# Patient Record
Sex: Male | Born: 1985 | Race: Black or African American | Hispanic: No | Marital: Single | Smoking: Current some day smoker
Health system: Southern US, Community
[De-identification: ages and names within clinical notes are randomized; demographics above are authoritative.]

---

## 2007-11-03 ENCOUNTER — Emergency Department (HOSPITAL_COMMUNITY): Admission: EM | Admit: 2007-11-03 | Discharge: 2007-11-03 | Payer: Self-pay | Admitting: Emergency Medicine

## 2008-02-12 ENCOUNTER — Emergency Department (HOSPITAL_COMMUNITY): Admission: EM | Admit: 2008-02-12 | Discharge: 2008-02-12 | Payer: Self-pay | Admitting: Emergency Medicine

## 2009-05-27 ENCOUNTER — Emergency Department (HOSPITAL_COMMUNITY): Admission: EM | Admit: 2009-05-27 | Discharge: 2009-05-27 | Payer: Self-pay | Admitting: Emergency Medicine

## 2010-12-28 ENCOUNTER — Emergency Department (HOSPITAL_COMMUNITY)
Admission: EM | Admit: 2010-12-28 | Discharge: 2010-12-28 | Disposition: A | Discharge status: Home / Self Care | Payer: Self-pay | Attending: Emergency Medicine | Admitting: Emergency Medicine

## 2011-08-30 IMAGING — CR DG CHEST 2V
2 series · 2 of 2 positions shown · non-contrast
Comparison: None

CLINICAL DATA: Chest pain, shortness of breath

CHEST - 2 VIEW

[w chest pa]
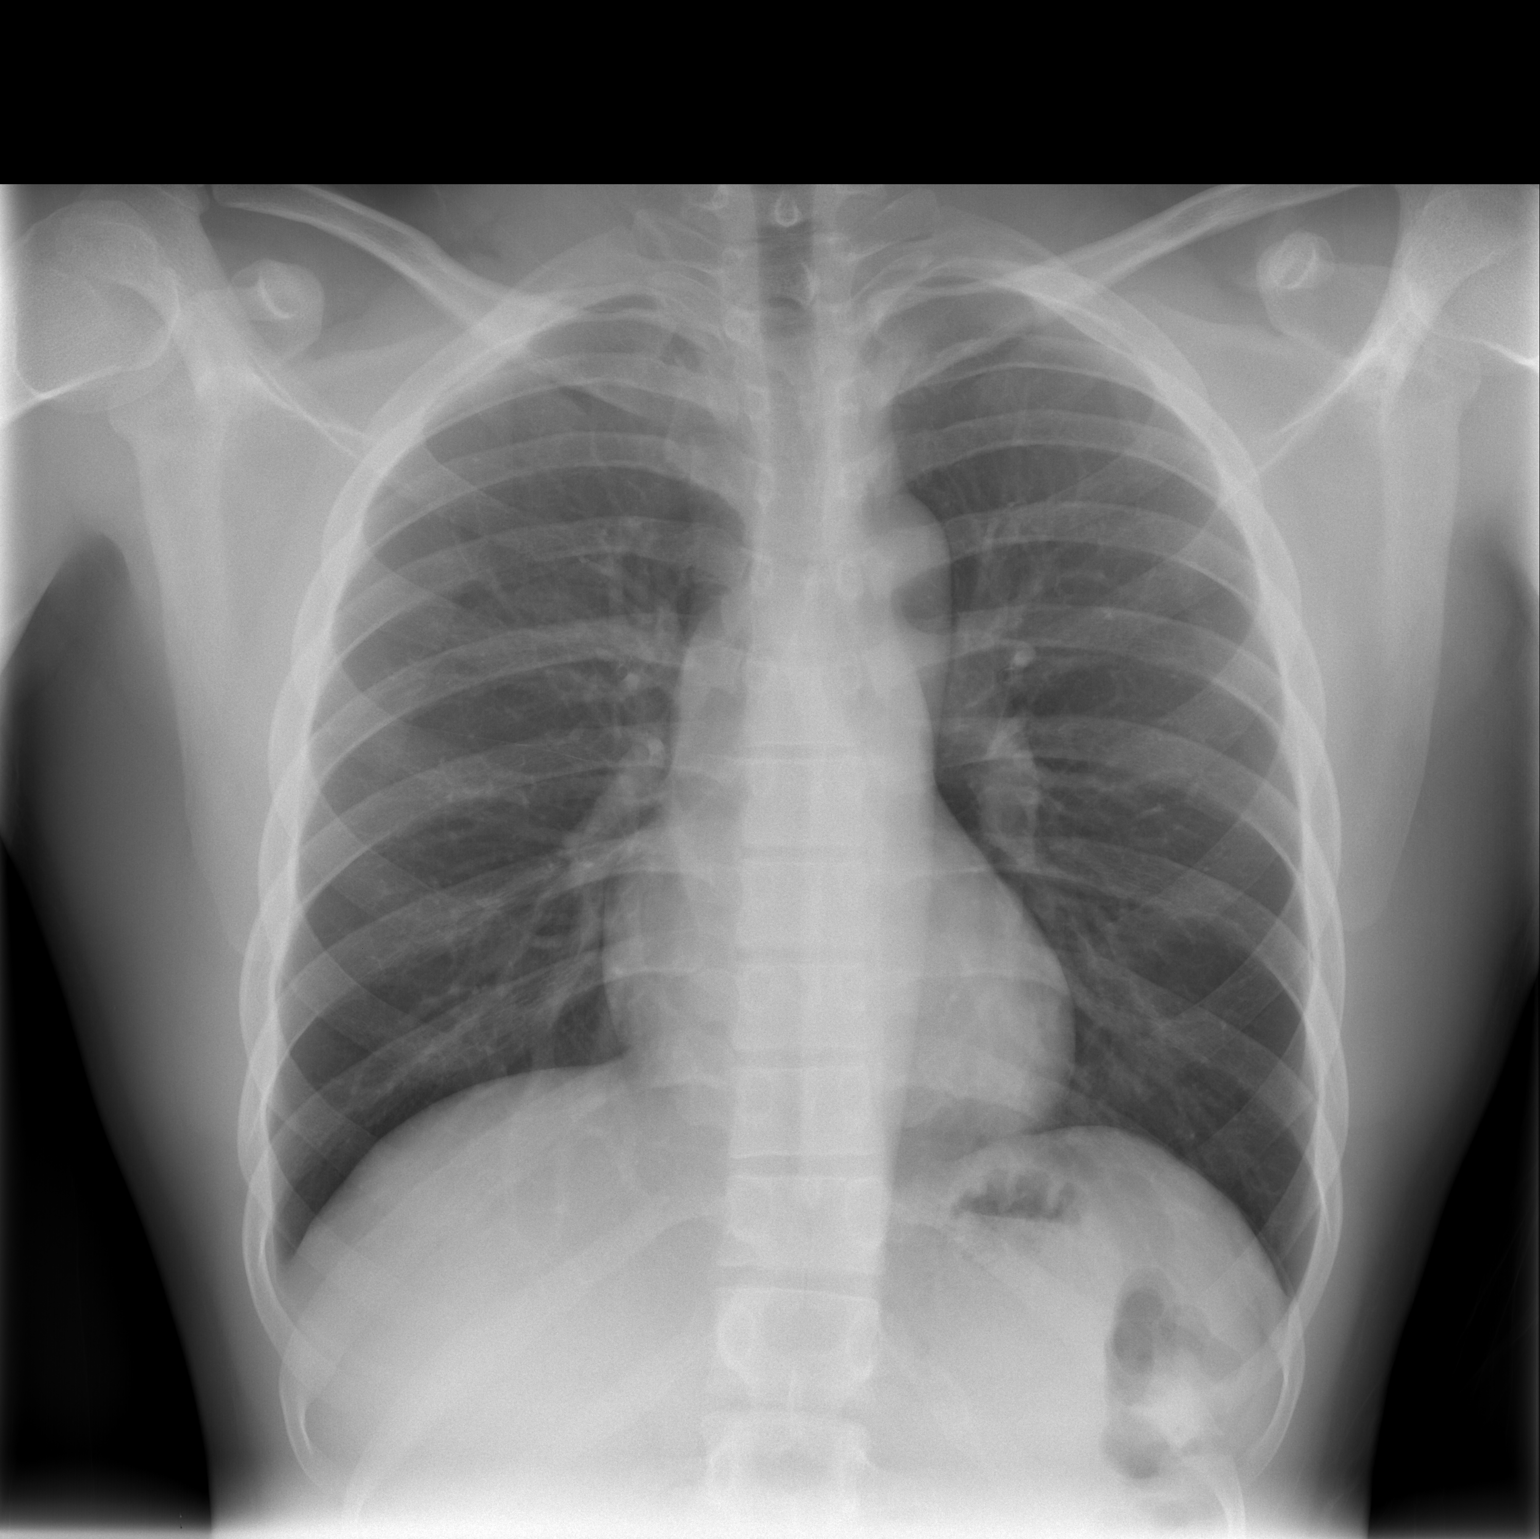

[w chest lat]
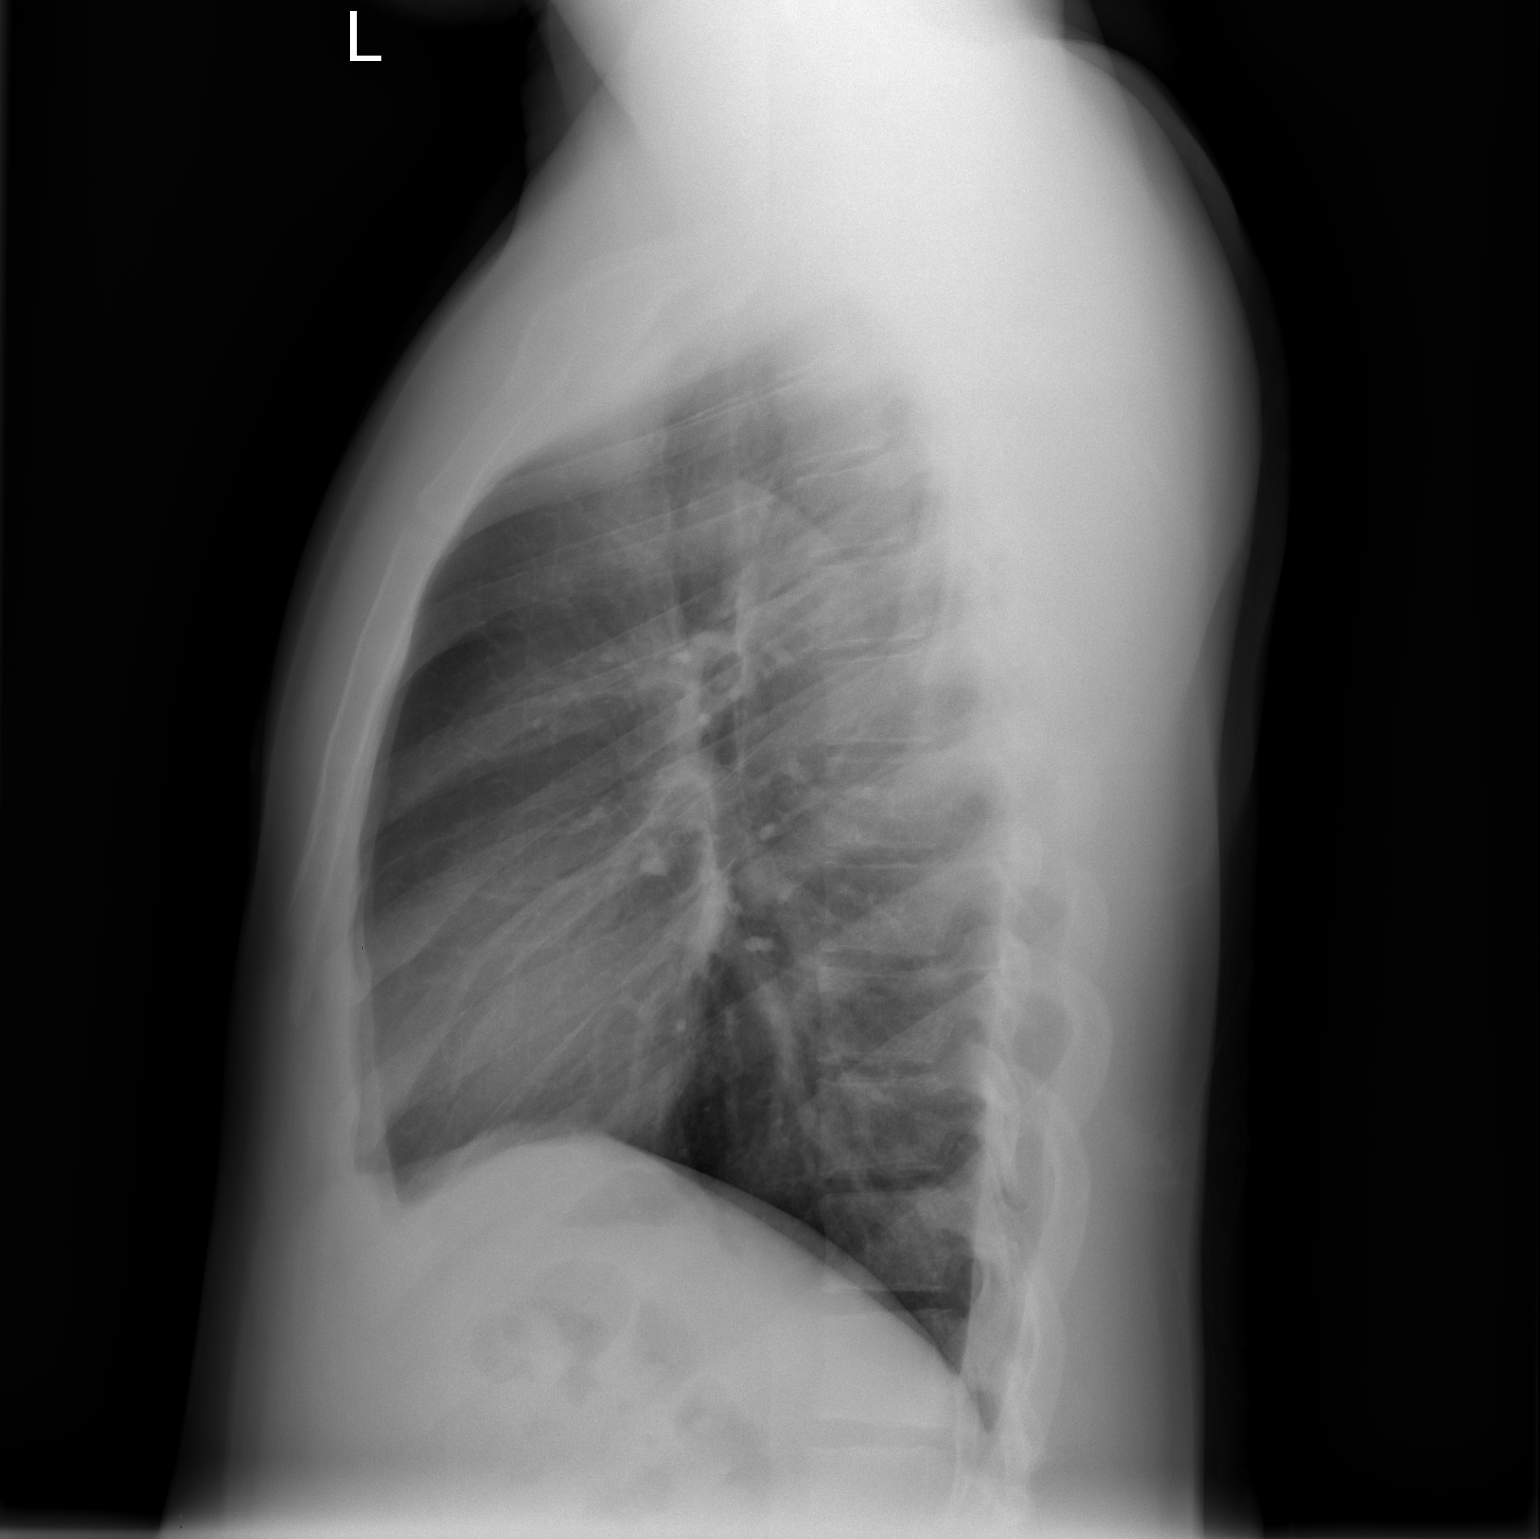

[2 of 2 positions shown; findings below may reference images not displayed]

FINDINGS: The lungs are clear.  The heart is within normal limits
in size.  Mediastinal contours are normal.  No bony abnormality is
seen.
IMPRESSION: No active lung disease.

## 2013-08-16 ENCOUNTER — Emergency Department (HOSPITAL_COMMUNITY): Payer: 59

## 2013-08-16 ENCOUNTER — Encounter (HOSPITAL_COMMUNITY): Payer: Self-pay | Admitting: Emergency Medicine

## 2013-08-16 ENCOUNTER — Emergency Department (HOSPITAL_COMMUNITY)
Admission: EM | Admit: 2013-08-16 | Discharge: 2013-08-16 | Disposition: A | Discharge status: Home / Self Care | Payer: 59 | Attending: Emergency Medicine | Admitting: Emergency Medicine

## 2013-08-16 DIAGNOSIS — F17200 Nicotine dependence, unspecified, uncomplicated: Secondary | ICD-10-CM | POA: Insufficient documentation

## 2013-08-16 DIAGNOSIS — R944 Abnormal results of kidney function studies: Secondary | ICD-10-CM | POA: Insufficient documentation

## 2013-08-16 DIAGNOSIS — R079 Chest pain, unspecified: Secondary | ICD-10-CM | POA: Insufficient documentation

## 2013-08-16 LAB — BASIC METABOLIC PANEL
BUN: 16 mg/dL (ref 6–23)
CALCIUM: 9.4 mg/dL (ref 8.4–10.5)
CO2: 28 mEq/L (ref 19–32)
Chloride: 101 mEq/L (ref 96–112)
Creatinine, Ser: 1.45 mg/dL — ABNORMAL HIGH (ref 0.50–1.35)
GFR calc Af Amer: 76 mL/min — ABNORMAL LOW (ref >90)
GFR, EST NON AFRICAN AMERICAN: 66 mL/min — AB (ref >90)
Glucose, Bld: 95 mg/dL (ref 70–99)
Potassium: 4.7 mEq/L (ref 3.7–5.3)
SODIUM: 139 meq/L (ref 137–147)

## 2013-08-16 LAB — CBC
HCT: 41.8 % (ref 39.0–52.0)
HEMOGLOBIN: 14.4 g/dL (ref 13.0–17.0)
MCH: 31.2 pg (ref 26.0–34.0)
MCHC: 34.4 g/dL (ref 30.0–36.0)
MCV: 90.5 fL (ref 78.0–100.0)
PLATELETS: 152 10*3/uL (ref 150–400)
RBC: 4.62 MIL/uL (ref 4.22–5.81)
RDW: 12.8 % (ref 11.5–15.5)
WBC: 3.5 10*3/uL — ABNORMAL LOW (ref 4.0–10.5)

## 2013-08-16 LAB — I-STAT TROPONIN, ED: Troponin i, poc: 0.01 ng/mL (ref 0.00–0.08)

## 2013-08-16 MED ORDER — SODIUM CHLORIDE 0.9 % IV BOLUS (SEPSIS)
1000.0000 mL | Freq: Once | INTRAVENOUS | Status: AC
  Administered 2013-08-16: 1000 mL via INTRAVENOUS

## 2013-08-16 MED ORDER — HYDROCODONE-ACETAMINOPHEN 5-325 MG PO TABS: 1.0000 | ORAL_TABLET | ORAL | Status: AC | PRN

## 2013-08-16 MED ORDER — MORPHINE SULFATE 4 MG/ML IJ SOLN
4.0000 mg | Freq: Once | INTRAMUSCULAR | Status: AC
  Administered 2013-08-16: 4 mg via INTRAVENOUS
  Filled 2013-08-16: qty 1

## 2013-08-16 NOTE — ED Notes (Signed)
Per pt, states left sided chest pain since yesterday radiating to center of back-some SOB

## 2013-08-16 NOTE — ED Provider Notes (Signed)
CSN: [id number]     Arrival date & time 08/16/13  1215 History   First MD Initiated Contact with Patient 08/16/13 1252     Chief Complaint  Patient presents with  . Chest Pain     (Consider location/radiation/quality/duration/timing/severity/associated sxs/prior Treatment) HPI Comments: The patient is a healthy 28 year old male, sent to the emergency department with a chief complaint of left-sided chest discomfort since yesterday. The patient reports a gradual onset in the morning which increased throughout the day. He is described as constant left-sided, nonexertional, sharp.  Aggravating factors include leaning forward, landing on size, deep inspiration, deep expiration, left upper extremity movement. She reports a similar episode previously but was less intense, self resolved, and he was not evaluated by medical professional that time.  He denies recent GI illness, or upper respiratory infection. He reports last EtOH over 2 days ago. Denies IVDU, amphetamine, and cocaine use.  The history is provided by the patient. No language interpreter was used.    History reviewed. No pertinent past medical history. History reviewed. No pertinent past surgical history. No family history on file. History  Substance Use Topics  . Smoking status: Current Some Day Smoker  . Smokeless tobacco: Not on file  . Alcohol Use: Yes    Review of Systems  Constitutional: Negative for fever and chills.  HENT: Negative for congestion, rhinorrhea and sore throat.   Respiratory: Negative for cough, chest tightness, shortness of breath and wheezing.   Cardiovascular: Positive for chest pain. Negative for palpitations and leg swelling.  Gastrointestinal: Negative for nausea, abdominal pain, diarrhea and constipation.  Skin: Negative for rash.  Neurological: Negative for syncope and light-headedness.      Allergies  Review of patient's allergies indicates no known allergies.  Home Medications   Prior to  Admission medications   Medication Sig Start Date End Date Taking? Authorizing Provider  Aspirin-Acetaminophen-Caffeine (GOODY HEADACHE PO) Take 1 packet by mouth once as needed (pain.).   Yes Historical Provider, MD  PRESCRIPTION MEDICATION Take 1 puff by mouth once. Inhaler.   Yes Historical Provider, MD   BP 129/85  Pulse 73  Temp(Src) 98 F (36.7 C) (Oral)  Resp 20  SpO2 100% Physical Exam  Nursing note and vitals reviewed. Constitutional: He is oriented to person, place, and time. He appears well-developed and well-nourished.  Non-toxic appearance. He does not have a sickly appearance. He does not appear ill. No distress.  HENT:  Head: Normocephalic and atraumatic.  Eyes: EOM are normal. Pupils are equal, round, and reactive to light. Right eye exhibits no discharge. Left eye exhibits no discharge.  Neck: Neck supple.  Cardiovascular: Normal rate and regular rhythm.   No murmur heard. No lower extremity edema  Pulmonary/Chest: Effort normal and breath sounds normal. No respiratory distress. He has no wheezes. He has no rales. He exhibits no tenderness.  Patient is able to speak in complete sentences.   Abdominal: Soft. He exhibits no mass. There is no tenderness. There is no rebound and no guarding.  Musculoskeletal: Normal range of motion.  Neurological: He is alert and oriented to person, place, and time.  Skin: Skin is warm and dry. He is not diaphoretic.  Psychiatric: He has a normal mood and affect. His behavior is normal.    ED Course  Procedures (including critical care time) Labs Review Labs Reviewed  CBC - Abnormal; Notable for the following:    WBC 3.5 (*)    All other components within normal limits  BASIC METABOLIC  PANEL - Abnormal; Notable for the following:    Creatinine, Ser 1.45 (*)    GFR calc non Af Amer 66 (*)    GFR calc Af Amer 76 (*)    All other components within normal limits  I-STAT TROPOININ, ED    Imaging Review Dg Chest 2  View  08/16/2013   CLINICAL DATA:  Mid to left-sided chest pain  EXAM: CHEST  2 VIEW  COMPARISON:  Prior chest x-ray 05/27/2009  FINDINGS: The lungs are clear and negative for focal airspace consolidation, pulmonary edema or suspicious pulmonary nodule. No pleural effusion or pneumothorax. Cardiac and mediastinal contours are within normal limits. No acute fracture or lytic or blastic osseous lesions. The visualized upper abdominal bowel gas pattern is unremarkable.  IMPRESSION: No active cardiopulmonary disease.   Electronically Signed   By: Malachy MoanHeath  McCullough M.D.   On: 08/16/2013 13:35     EKG Interpretation   Date/Time:  Thursday August 16 2013 12:28:40 EDT Ventricular Rate:  76 PR Interval:  172 QRS Duration: 90 QT Interval:  361 QTC Calculation: 406 R Axis:   74 Text Interpretation:  Sinus rhythm Inverted T waves have replaced  nonspecific T wave abnormality in Nonspecific ST elevation Baseline wander  in lead(s) III No previous tracing Confirmed by POLLINA  MD, CHRISTOPHER  [phone]([id number]) on 08/16/2013 1:34:33 PM      MDM   Final diagnoses:  Chest pain  Abnormal renal function test   Pt with constant chest pain since last night, reproducible with position and left upper extremity movement.  PERC negative, No risk factors for endocarditis, no risk factors for pericarditis (also pain increase with sitting forward), No risk factors for aortic dissection. Discussed with Dr. Blinda LeatherwoodPollina, advises troponin for other non-ACS related cardiac etiologies.  Re-eval discussed results with patient.  And need for IV fluids due to Cr levels.  Advise follow testing with PCP.  Requesting pain medication at this time. Meds given in ED:  Medications  sodium chloride 0.9 % bolus 1,000 mL (0 mLs Intravenous Stopped 08/16/13 1648)  morphine 4 MG/ML injection 4 mg (4 mg Intravenous Given 08/16/13 1619)    Discharge Medication List as of 08/16/2013  4:09 PM    START taking these medications   Details   HYDROcodone-acetaminophen (NORCO/VICODIN) 5-325 MG per tablet Take 1 tablet by mouth every 4 (four) hours as needed., Starting 08/16/2013, Until Discontinued, Print          Clabe SealLauren M Barrett Goldie, PA-C 08/17/13 1541

## 2013-08-16 NOTE — ED Notes (Signed)
Pt escorted to discharge window. Pt verbalized understanding discharge instructions. In no acute distress.  

## 2013-08-16 NOTE — ED Notes (Signed)
Bed: WA17 Expected date:  Expected time:  Means of arrival:  Comments: Hold for triage 1 

## 2013-08-16 NOTE — ED Notes (Signed)
Pt alert and oriented x4. Respirations even and unlabored, bilateral symmetrical rise and fall of chest. Skin warm and dry. In no acute distress. Denies needs.   

## 2013-08-16 NOTE — Discharge Instructions (Signed)
Call for a follow up appointment with a Family or Primary Care Provider for repeat testing of your renal function.  Call a Cardiologist for further evaluation of your chest pain. Return if symptoms worsen, you have shortness of breath, fever, cough, lightheadedness .  Take medication as prescribed.    Emergency Department Resource Guide 1) Find a Doctor and Pay Out of Pocket Although you won't have to find out who is covered by your insurance plan, it is a good idea to ask around and get recommendations. You will then need to call the office and see if the doctor you have chosen will accept you as a new patient and what types of options they offer for patients who are self-pay. Some doctors offer discounts or will set up payment plans for their patients who do not have insurance, but you will need to ask so you aren't surprised when you get to your appointment.  2) Contact Your Local Health Department Not all health departments have doctors that can see patients for sick visits, but many do, so it is worth a call to see if yours does. If you don't know where your local health department is, you can check in your phone book. The CDC also has a tool to help you locate your state's health department, and many state websites also have listings of all of their local health departments.  3) Find a Walk-in Clinic If your illness is not likely to be very severe or complicated, you may want to try a walk in clinic. These are popping up all over the country in pharmacies, drugstores, and shopping centers. They're usually staffed by nurse practitioners or physician assistants that have been trained to treat common illnesses and complaints. They're usually fairly quick and inexpensive. However, if you have serious medical issues or chronic medical problems, these are probably not your best option.  No Primary Care Doctor: - Call Health Connect at  [phone] - they can help you locate a primary care doctor that   accepts your insurance, provides certain services, etc. - Physician Referral Service- [phone]  Chronic Pain Problems: Organization         Address  Phone   Notes  [facility]  [phone] Patients need to be referred by their primary care doctor.   Medication Assistance: Organization         Address  Phone   Notes  [facility] [address] [place], [place] [address] [phone] --Must be a resident of [place] [facility] [place] -- Must have NO insurance coverage whatsoever (no Medicaid/ Medicare, etc.) -- The pt. MUST have a primary care doctor that directs their care regularly and follows them in the community   MedAssist  [phone]   [employer]  [phone]    Agencies that provide inexpensive medical care: Organization         Address  Phone   Notes  [facility]  [phone]   [facility]    [phone]   [facility] [address] [place], [place] [address] [phone]   [facility] [address], [place] [phone]   [facility]    [phone]   [facility]    [phone]   [facility]  [address], [place] Phone:  [phone], Fax:  [phone] Hours of Operation:  9  am - 6 pm, M-F.  Also accepts Medicaid/Medicare and self-pay.  [facility] for [place] [address], [address], [place] Phone: [phone], Fax: [phone]. Hours of Operation:  8:30 am - 5:30 pm, M-F.  Also accepts Medicaid and self-pay.  [facility] [place] [address], [place] Phone: [phone]   [place], [place], [place] [phone], Ext. [phone] Mondays & Thursdays: 7-9 AM.  First 15 patients are seen on a first come, first serve basis.    [place] Providers:  Organization          Address  Phone   Notes  [facility] [address], [place] [phone] Also accepts self-pay patients.  [facility] [address], [place]  [phone]   [place], [address], [place] [phone]   [facility] [address], [place] [phone]   Lucianne Lei [address], [place]   [phone] Only accepts [place] Access [place] patients after they have their name applied to their card.   Self-Pay (no insurance) in [facility]:  Organization         Address  Phone   Notes  Sickle Cell Patients, [facility] [place] [phone]   [facility] [place] [phone]   [facility] [place]  [place], [place], [place] [phone]   [facility]/Dr. Osei-Bonsu  [address], [place] or [address], [place] [phone] Phone number for both [place] and [place] locations is the same.  [facility] [address], [place] [phone]   [facility] [address], [place] or [address] [phone]   [facility] [address], [place] [phone], phone; [phone], fax Sees patients 1st and 3rd Saturday of every month.  Must not qualify for public or private insurance (i.e. Medicaid, Medicare, [place] Health Choice, Veterans' Benefits)  Household income should be no more than 200% of the poverty level The clinic cannot treat you if you are pregnant or think you are pregnant  Sexually transmitted diseases are not treated at the clinic.    Dental Care: Organization         Address  Phone  Notes  [facility] of [facility] [place] [phone] Accepts children up to age 52 who are enrolled in [place] or [place]; pregnant women with a Medicaid card; and children who have applied for Medicaid or [place] Health Choice, but were declined, whose parents can pay a reduced fee at time of service.  [facility] Department of [facility]  [address], [place] [phone] Accepts children up to age 78 who are enrolled in [place] or [place]; pregnant women with a Medicaid card; and children who have applied for Medicaid or  Health Choice, but were declined, whose parents can pay a reduced fee at time of service.  [facility] Adult [facility] Access PROGRAM  [place] [phone] Patients are seen by appointment only. Walk-ins are not accepted. [place] will see patients 18 years of  age and older. Monday - Tuesday (8am-5pm) Most Wednesdays (8:30-5pm) $30 per visit, cash only  [facility] Adult Dental Access PROGRAM  [address], [facility] [phone] Patients are seen by appointment only. Walk-ins are not accepted. [place] will see patients 57 years of age and older. One Wednesday Evening (Monthly: Volunteer Based).  $30 per visit, cash only  [place]  [phone] for adults; Children under age 78, call Graduate Pediatric Dentistry at [phone]. Children aged 43-14, please call [phone] to request a pediatric application.  Dental services are provided in all areas of dental care including fillings, crowns and bridges, complete and partial dentures, implants, gum treatment, root canals, and extractions. Preventive care is also provided. Treatment is provided to both adults and children. Patients are selected via a lottery and there is often a waiting list.   [facility] [address] [place]  [phone] [email or web address]   [facility] [address] [place], [place]  [phone], Ext. [phone] Second and Fourth Thursday of each month, opens at 6:30 AM; Clinic ends at 9 AM.  Patients are seen on a first-come first-served basis, and a limited number are seen during each clinic.   [facility]  [address] Hillard Danker [place], [place] [phone]   Eligibility Requirements You must have lived in [place], [place], or [place] counties for at least the last three months.   You cannot be eligible for state or federal sponsored [employer], including [employer], [place], or [employer].   You generally cannot be eligible for healthcare insurance through your employer.    How to apply: Eligibility screenings are held every Tuesday and Wednesday afternoon from 1:00 pm until 4:00 pm. You do not need an appointment for the interview!  [facility] [address] [place], [place]   [place]  [place] Department  [place]  [phone]    Behavioral Health Resources in the Community: Intensive Outpatient Programs Organization         Address  Phone  Notes  [place]. [address], [place], [place] [phone]   [facility] Outpatient [address], [place], [place]   ADS: Alcohol & Drug Svcs [address] [place], [place]   [place] [address],  [place], [place] or [phone]   Substance Abuse Resources Organization         Address  Phone  Notes  Alcohol and Drug Services  [phone]   [place]  [phone]   The [place]   Chinita Pester  [phone]   Residential & Outpatient Substance Abuse Program  [phone]   Psychological Services Organization         Address  Phone  Notes  [facility] [place]  [place]  [phone]    [facility] [address] [place] or [phone]    Mobile Crisis Teams Organization         Address  Phone  Notes  [facility], Mobile Crisis Care Unit  [phone]   [facility] Psychotherapeutic Services  [address] [place], [place]   [facility] [address] [place] [phone]    Self-Help/Support Groups Organization  Address  Phone             Notes  [place]. of [place] - variety of support groups  [place] Call for more information  Narcotics Anonymous (NA), [facility] [address], [employer] [place]  2 meetings at this location   Special educational needs teacher         Address  Phone  Notes  ASAP Residential Treatment [place],    [place]  [phone]   [facility]  [address] [place] [phone], [place], [place]   [place], [place] [phone] Admissions: 8am-3pm M-F  Incentives Substance [place] [address]    [place], [place] [phone]   [facility] [address] [place], [place], [place]   [facility] [address]  [place], [place]   [facility] Programs - Intensive Outpatient [address], [place], [place]   [facility] ([place] [address]  [place], [place] [phone] or [phone]   [facility]idential Treatment Services (RTS) [address] [place], [place] Accepts Medicaid  [facility] [place] [address]  [place] [phone] Substance Abuse/Addiction Treatment   [facility] Organization         Address  Phone  Notes  [facility]  7097276150   Domenic Schwab, PhD [address] Arlis Porta [place], [place]   [phone] or [phone]   [place], [place] [phone]   [facility] [address], [place], [place] [phone] Insurance/Medicaid/sponsorship through [facility] and Families [address] [place]                                    [place], [place] [phone] [place] [address], [place] [phone]    Dr. Adele Schilder  [phone]   [facility] [place] Dept. 1) [address] [place] 2) [place] 3)  [address], [place] [phone]  [phone]   [place] [phone] or [phone] (After Hours)

## 2013-08-18 NOTE — ED Provider Notes (Signed)
Medical screening examination/treatment/procedure(s) were performed by non-physician practitioner and as supervising physician I was immediately available for consultation/collaboration.   EKG Interpretation   Date/Time:  Thursday August 16 2013 12:28:40 EDT Ventricular Rate:  76 PR Interval:  172 QRS Duration: 90 QT Interval:  361 QTC Calculation: 406 R Axis:   74 Text Interpretation:  Sinus rhythm Inverted T waves have replaced  nonspecific T wave abnormality in Nonspecific ST elevation Baseline wander  in lead(s) III No previous tracing Confirmed by Blinda LeatherwoodPOLLINA  MD, CHRISTOPHER  [phone]([id number]) on 08/16/2013 1:34:33 PM        Gilda Creasehristopher J. Pollina, MD 08/18/13 [id number]

## 2015-11-19 IMAGING — CR DG CHEST 2V
2 series · 2 of 2 positions shown · non-contrast
Comparison: Prior chest x-ray 05/27/2009

CLINICAL DATA: Mid to left-sided chest pain

EXAM:
CHEST  2 VIEW

[w chest pa]
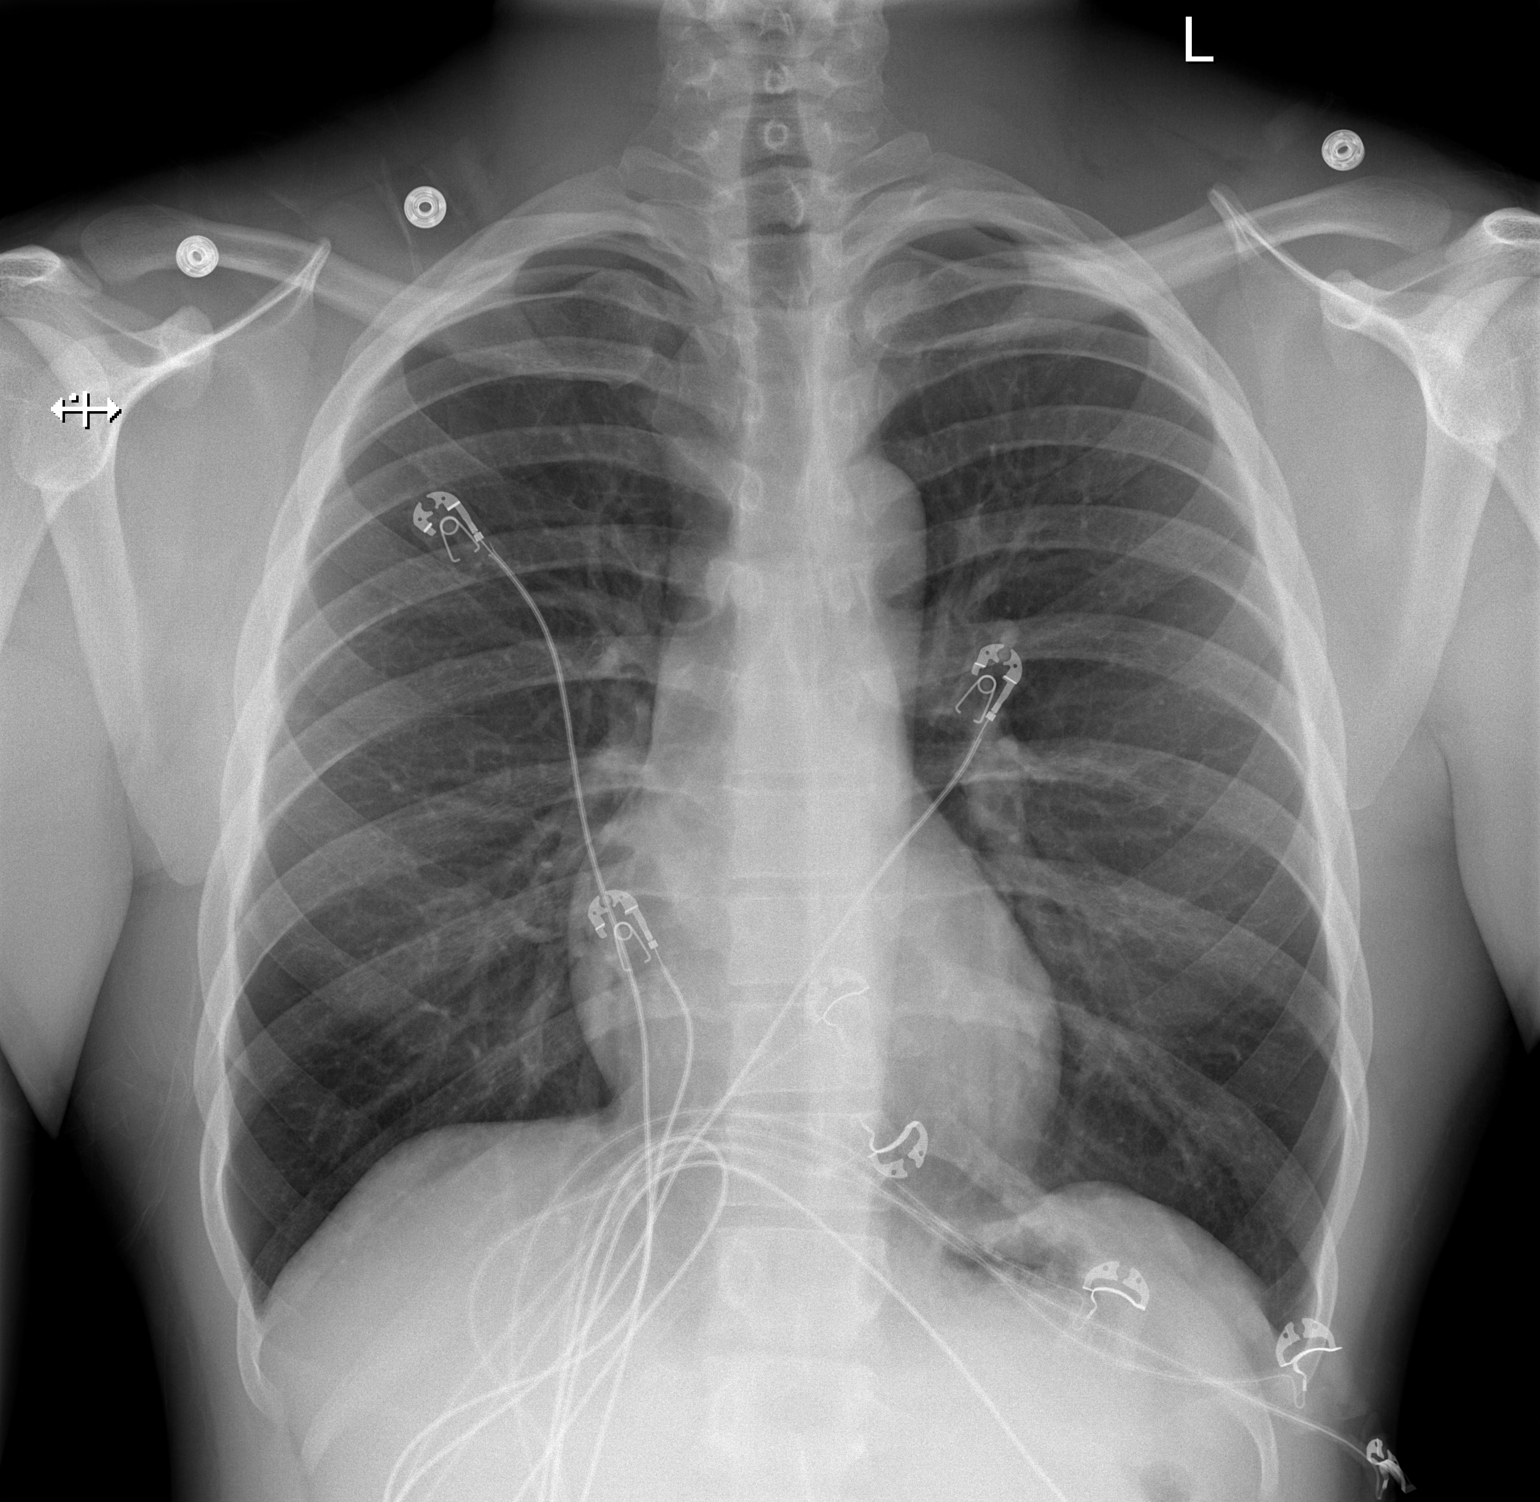

[w chest lat]
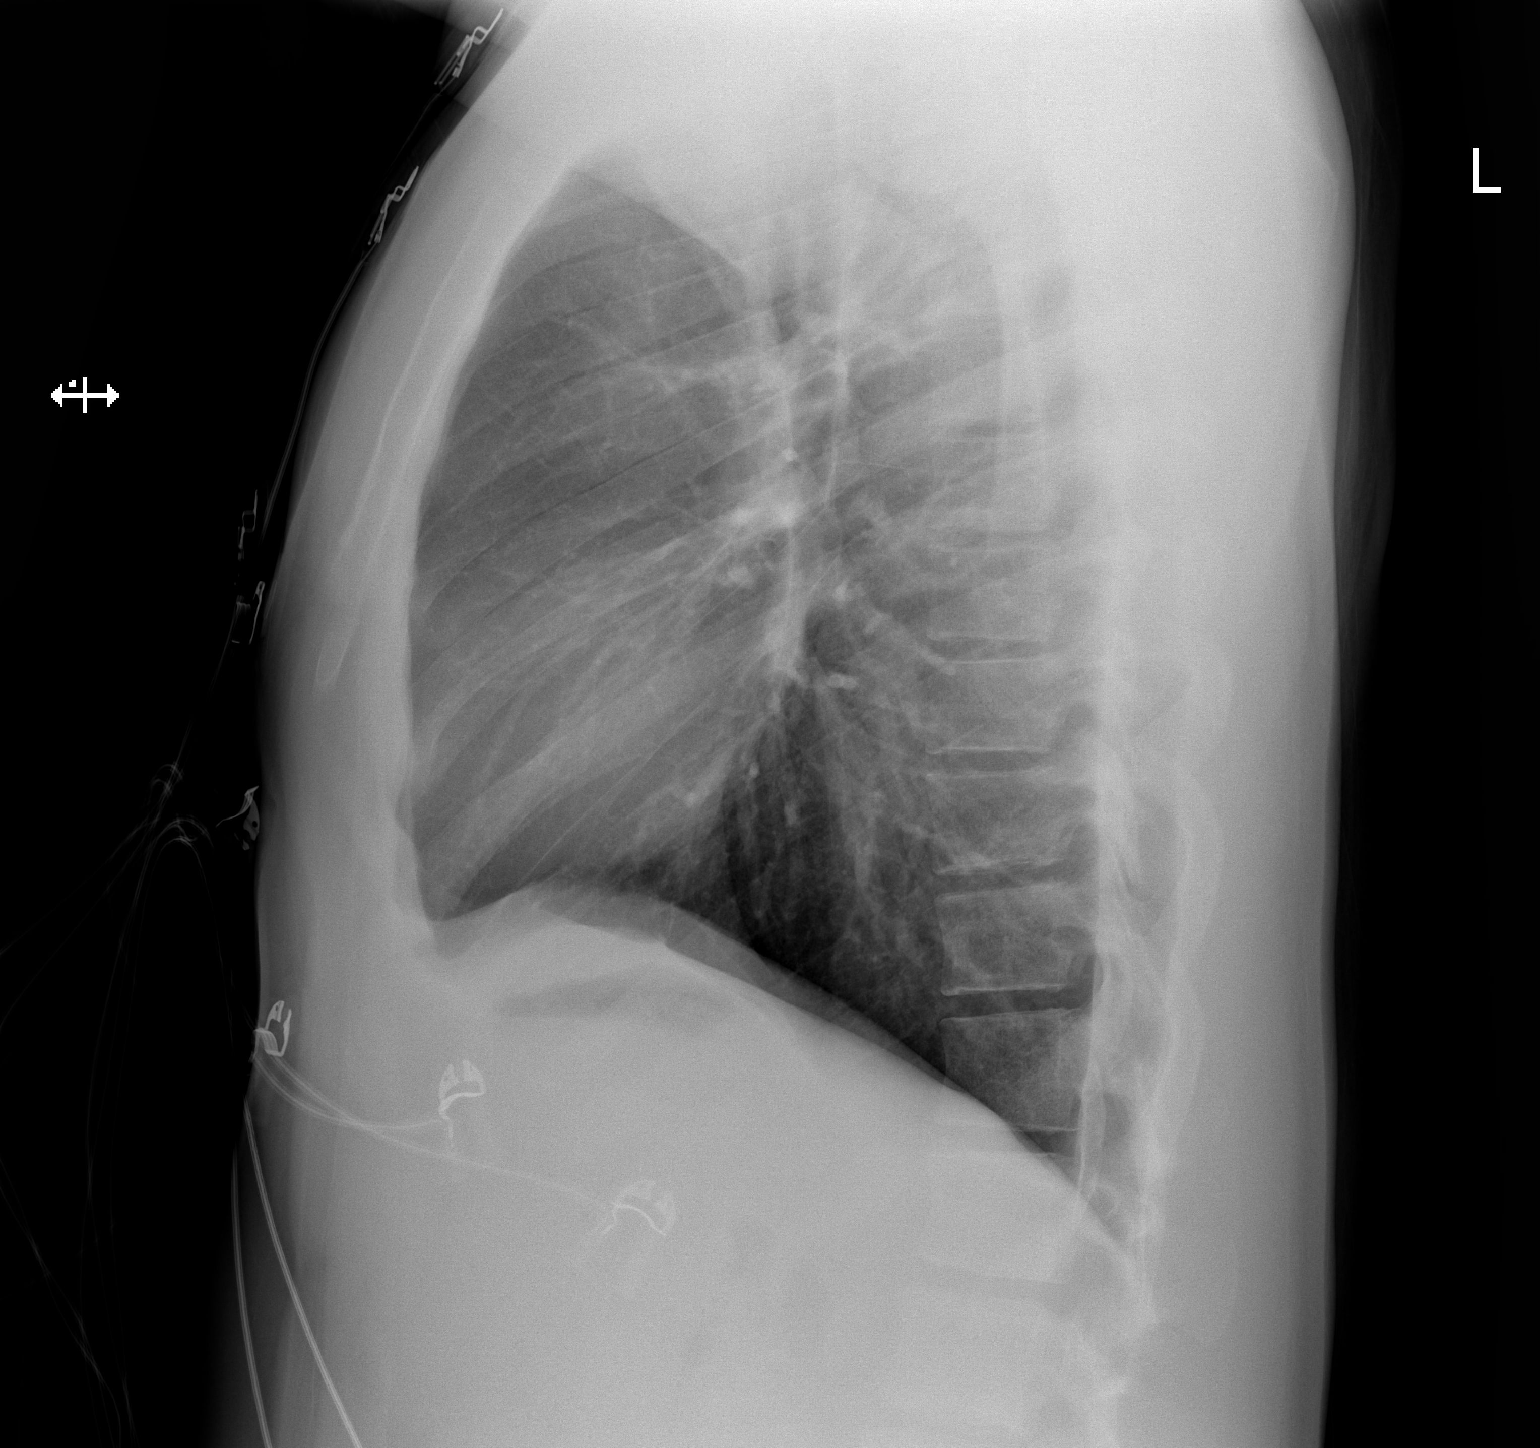

[2 of 2 positions shown; findings below may reference images not displayed]

FINDINGS: The lungs are clear and negative for focal airspace consolidation,
pulmonary edema or suspicious pulmonary nodule. No pleural effusion
or pneumothorax. Cardiac and mediastinal contours are within normal
limits. No acute fracture or lytic or blastic osseous lesions. The
visualized upper abdominal bowel gas pattern is unremarkable.
IMPRESSION: No active cardiopulmonary disease.
